# Patient Record
Sex: Female | Born: 1970 | Race: Black or African American | Hispanic: No | Marital: Single | Smoking: Never smoker
Health system: Southern US, Community
[De-identification: ages and names within clinical notes are randomized; demographics above are authoritative.]

## PROBLEM LIST (undated history)

## (undated) DIAGNOSIS — J9819 Other pulmonary collapse: Secondary | ICD-10-CM

## (undated) HISTORY — PX: OTHER SURGICAL HISTORY: SHX169

## (undated) HISTORY — PX: ABDOMINAL HYSTERECTOMY: SHX81

## (undated) HISTORY — PX: BREAST SURGERY: SHX581

## (undated) HISTORY — PX: LUNG SURGERY: SHX703

---

## 2007-09-10 ENCOUNTER — Emergency Department (HOSPITAL_BASED_OUTPATIENT_CLINIC_OR_DEPARTMENT_OTHER): Admission: EM | Admit: 2007-09-10 | Discharge: 2007-09-10 | Payer: Self-pay | Admitting: Emergency Medicine

## 2007-11-28 ENCOUNTER — Emergency Department (HOSPITAL_BASED_OUTPATIENT_CLINIC_OR_DEPARTMENT_OTHER): Admission: EM | Admit: 2007-11-28 | Discharge: 2007-11-28 | Payer: Self-pay | Admitting: Emergency Medicine

## 2009-04-21 ENCOUNTER — Emergency Department (HOSPITAL_BASED_OUTPATIENT_CLINIC_OR_DEPARTMENT_OTHER): Admission: EM | Admit: 2009-04-21 | Discharge: 2009-04-22 | Payer: Self-pay | Admitting: Emergency Medicine

## 2009-04-21 ENCOUNTER — Ambulatory Visit: Payer: Self-pay | Admitting: Diagnostic Radiology

## 2009-04-22 ENCOUNTER — Ambulatory Visit: Payer: Self-pay | Admitting: Diagnostic Radiology

## 2010-01-02 ENCOUNTER — Emergency Department (HOSPITAL_BASED_OUTPATIENT_CLINIC_OR_DEPARTMENT_OTHER): Admission: EM | Admit: 2010-01-02 | Discharge: 2010-01-02 | Payer: Self-pay | Admitting: Emergency Medicine

## 2010-06-22 LAB — URINALYSIS, ROUTINE W REFLEX MICROSCOPIC
Glucose, UA: NEGATIVE mg/dL
Hgb urine dipstick: NEGATIVE
Nitrite: NEGATIVE
Specific Gravity, Urine: 1.019 (ref 1.005–1.030)

## 2011-01-01 LAB — RAPID STREP SCREEN (MED CTR MEBANE ONLY): Streptococcus, Group A Screen (Direct): NEGATIVE

## 2012-08-07 ENCOUNTER — Emergency Department (HOSPITAL_BASED_OUTPATIENT_CLINIC_OR_DEPARTMENT_OTHER): Payer: No Typology Code available for payment source

## 2012-08-07 ENCOUNTER — Emergency Department (HOSPITAL_BASED_OUTPATIENT_CLINIC_OR_DEPARTMENT_OTHER)
Admission: EM | Admit: 2012-08-07 | Discharge: 2012-08-07 | Disposition: A | Discharge status: Home / Self Care | Payer: No Typology Code available for payment source | Attending: Emergency Medicine | Admitting: Emergency Medicine

## 2012-08-07 ENCOUNTER — Encounter (HOSPITAL_BASED_OUTPATIENT_CLINIC_OR_DEPARTMENT_OTHER): Payer: Self-pay

## 2012-08-07 DIAGNOSIS — Y92410 Unspecified street and highway as the place of occurrence of the external cause: Secondary | ICD-10-CM | POA: Insufficient documentation

## 2012-08-07 DIAGNOSIS — M25572 Pain in left ankle and joints of left foot: Secondary | ICD-10-CM

## 2012-08-07 DIAGNOSIS — M25562 Pain in left knee: Secondary | ICD-10-CM

## 2012-08-07 DIAGNOSIS — S99929A Unspecified injury of unspecified foot, initial encounter: Secondary | ICD-10-CM | POA: Insufficient documentation

## 2012-08-07 DIAGNOSIS — M25552 Pain in left hip: Secondary | ICD-10-CM

## 2012-08-07 DIAGNOSIS — Y9389 Activity, other specified: Secondary | ICD-10-CM | POA: Insufficient documentation

## 2012-08-07 DIAGNOSIS — S8990XA Unspecified injury of unspecified lower leg, initial encounter: Secondary | ICD-10-CM | POA: Insufficient documentation

## 2012-08-07 DIAGNOSIS — S79919A Unspecified injury of unspecified hip, initial encounter: Secondary | ICD-10-CM | POA: Insufficient documentation

## 2012-08-07 MED ORDER — TRAMADOL HCL 50 MG PO TABS: 50.0000 mg | ORAL_TABLET | Freq: Four times a day (QID) | ORAL | Status: DC | PRN

## 2012-08-07 NOTE — ED Notes (Addendum)
Pt further reports L foot pain that foot pain that increases when elevated or at rest. No obvious deformity or brusing noted. Pt reports hx of hyster no urine preg collected
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
## 2012-08-08 NOTE — ED Provider Notes (Signed)
Medical screening examination/treatment/procedure(s) were performed by non-physician practitioner and as supervising physician I was immediately available for consultation/collaboration.   Korey Arroyo, MD 08/08/12 2143 

## 2013-03-07 ENCOUNTER — Emergency Department (HOSPITAL_BASED_OUTPATIENT_CLINIC_OR_DEPARTMENT_OTHER)
Admission: EM | Admit: 2013-03-07 | Discharge: 2013-03-07 | Disposition: A | Discharge status: Home / Self Care | Payer: Managed Care, Other (non HMO) | Attending: Emergency Medicine | Admitting: Emergency Medicine

## 2013-03-07 ENCOUNTER — Emergency Department (HOSPITAL_BASED_OUTPATIENT_CLINIC_OR_DEPARTMENT_OTHER): Payer: Managed Care, Other (non HMO)

## 2013-03-07 ENCOUNTER — Encounter (HOSPITAL_BASED_OUTPATIENT_CLINIC_OR_DEPARTMENT_OTHER): Payer: Self-pay | Admitting: Emergency Medicine

## 2013-03-07 DIAGNOSIS — F439 Reaction to severe stress, unspecified: Secondary | ICD-10-CM

## 2013-03-07 DIAGNOSIS — R51 Headache: Secondary | ICD-10-CM | POA: Insufficient documentation

## 2013-03-07 DIAGNOSIS — F430 Acute stress reaction: Secondary | ICD-10-CM | POA: Insufficient documentation

## 2013-03-07 DIAGNOSIS — R5381 Other malaise: Secondary | ICD-10-CM | POA: Insufficient documentation

## 2013-03-07 DIAGNOSIS — R079 Chest pain, unspecified: Secondary | ICD-10-CM

## 2013-03-07 DIAGNOSIS — R1110 Vomiting, unspecified: Secondary | ICD-10-CM | POA: Insufficient documentation

## 2013-03-07 DIAGNOSIS — R0789 Other chest pain: Secondary | ICD-10-CM | POA: Insufficient documentation

## 2013-03-07 LAB — CBC WITH DIFFERENTIAL/PLATELET
Eosinophils Absolute: 0 10*3/uL (ref 0.0–0.7)
Eosinophils Relative: 0 % (ref 0–5)
HCT: 43.3 % (ref 36.0–46.0)
Hemoglobin: 14.4 g/dL (ref 12.0–15.0)
Lymphocytes Relative: 26 % (ref 12–46)
Lymphs Abs: 1.5 10*3/uL (ref 0.7–4.0)
MCHC: 33.3 g/dL (ref 30.0–36.0)
Neutro Abs: 3.8 10*3/uL (ref 1.7–7.7)
RBC: 5.15 MIL/uL — ABNORMAL HIGH (ref 3.87–5.11)

## 2013-03-07 LAB — BASIC METABOLIC PANEL
BUN: 14 mg/dL (ref 6–23)
CO2: 24 mEq/L (ref 19–32)
Creatinine, Ser: 0.9 mg/dL (ref 0.50–1.10)
GFR calc Af Amer: >90 mL/min (ref >90)
Glucose, Bld: 91 mg/dL (ref 70–99)
Potassium: 4.5 mEq/L (ref 3.5–5.1)
Sodium: 136 mEq/L (ref 135–145)

## 2013-03-07 MED ORDER — ASPIRIN 81 MG PO CHEW
324.0000 mg | CHEWABLE_TABLET | Freq: Once | ORAL | Status: AC
  Administered 2013-03-07: 324 mg via ORAL
  Filled 2013-03-07: qty 4

## 2013-03-07 MED ORDER — KETOROLAC TROMETHAMINE 30 MG/ML IJ SOLN
30.0000 mg | Freq: Once | INTRAMUSCULAR | Status: AC
  Administered 2013-03-07: 30 mg via INTRAVENOUS
  Filled 2013-03-07: qty 1
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
## 2013-03-07 NOTE — ED Notes (Signed)
Chest pain recurrent for past few days. Nausea, weakness, headache and arm tingling. Pt reports a lot of stress lately.

## 2014-10-09 ENCOUNTER — Encounter (HOSPITAL_COMMUNITY): Payer: Self-pay | Admitting: Emergency Medicine

## 2014-10-09 ENCOUNTER — Emergency Department (INDEPENDENT_AMBULATORY_CARE_PROVIDER_SITE_OTHER)
Admission: EM | Admit: 2014-10-09 | Discharge: 2014-10-09 | Disposition: A | Discharge status: Home / Self Care | Payer: Self-pay | Source: Home / Self Care | Attending: Emergency Medicine | Admitting: Emergency Medicine

## 2014-10-09 DIAGNOSIS — S39012A Strain of muscle, fascia and tendon of lower back, initial encounter: Secondary | ICD-10-CM

## 2014-10-09 DIAGNOSIS — S161XXA Strain of muscle, fascia and tendon at neck level, initial encounter: Secondary | ICD-10-CM

## 2014-10-09 MED ORDER — TRAMADOL HCL 50 MG PO TABS: 50.0000 mg | ORAL_TABLET | Freq: Four times a day (QID) | ORAL | Status: DC | PRN

## 2014-10-09 MED ORDER — CYCLOBENZAPRINE HCL 10 MG PO TABS: 10.0000 mg | ORAL_TABLET | Freq: Three times a day (TID) | ORAL | Status: DC | PRN

## 2014-10-09 MED ORDER — NAPROXEN 500 MG PO TABS: 500.0000 mg | ORAL_TABLET | Freq: Two times a day (BID) | ORAL | Status: DC

## 2014-10-09 NOTE — ED Notes (Signed)
C/o intermittent lower back pain that radiates to the head Reports she is under a lot of stress.... Loss of job Alert, no signs of acute distress.
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
## 2014-10-09 NOTE — Discharge Instructions (Signed)
You have strain and spasm of the muscles in your back and neck. Take Flexeril every 8 hours as needed for muscle spasms. This may make you sleepy. Take Naprosyn 1 pill twice a day for the next week, then as needed. Apply heat at least 2-3 times a day. Do gentle stretching and massage as often as you can. Use the tramadol every 6-8 hours as needed for pain. You should see gradual improvement over the next week. Follow-up as needed.

## 2017-03-03 ENCOUNTER — Other Ambulatory Visit: Payer: Self-pay

## 2017-03-03 ENCOUNTER — Encounter (HOSPITAL_BASED_OUTPATIENT_CLINIC_OR_DEPARTMENT_OTHER): Payer: Self-pay

## 2017-03-03 ENCOUNTER — Emergency Department (HOSPITAL_BASED_OUTPATIENT_CLINIC_OR_DEPARTMENT_OTHER)
Admission: EM | Admit: 2017-03-03 | Discharge: 2017-03-03 | Disposition: A | Discharge status: Home / Self Care | Payer: Self-pay | Attending: Emergency Medicine | Admitting: Emergency Medicine

## 2017-03-03 ENCOUNTER — Emergency Department (HOSPITAL_BASED_OUTPATIENT_CLINIC_OR_DEPARTMENT_OTHER): Payer: Self-pay

## 2017-03-03 DIAGNOSIS — R42 Dizziness and giddiness: Secondary | ICD-10-CM | POA: Insufficient documentation

## 2017-03-03 DIAGNOSIS — R110 Nausea: Secondary | ICD-10-CM | POA: Insufficient documentation

## 2017-03-03 DIAGNOSIS — K219 Gastro-esophageal reflux disease without esophagitis: Secondary | ICD-10-CM | POA: Insufficient documentation

## 2017-03-03 DIAGNOSIS — R0789 Other chest pain: Secondary | ICD-10-CM | POA: Insufficient documentation

## 2017-03-03 HISTORY — DX: Other pulmonary collapse: J98.19

## 2017-03-03 LAB — BASIC METABOLIC PANEL
Anion gap: 6 (ref 5–15)
BUN: 18 mg/dL (ref 6–20)
CO2: 26 mmol/L (ref 22–32)
Calcium: 9.3 mg/dL (ref 8.9–10.3)
Chloride: 104 mmol/L (ref 101–111)
Creatinine, Ser: 0.95 mg/dL (ref 0.44–1.00)
GFR calc Af Amer: >60 mL/min (ref >60)
GLUCOSE: 104 mg/dL — AB (ref 65–99)
POTASSIUM: 4.1 mmol/L (ref 3.5–5.1)
Sodium: 136 mmol/L (ref 135–145)

## 2017-03-03 LAB — CBC
HEMATOCRIT: 41.1 % (ref 36.0–46.0)
Hemoglobin: 13.5 g/dL (ref 12.0–15.0)
MCH: 27.8 pg (ref 26.0–34.0)
MCHC: 32.8 g/dL (ref 30.0–36.0)
MCV: 84.7 fL (ref 78.0–100.0)
Platelets: 262 10*3/uL (ref 150–400)
RBC: 4.85 MIL/uL (ref 3.87–5.11)
RDW: 13.9 % (ref 11.5–15.5)
WBC: 7.9 10*3/uL (ref 4.0–10.5)

## 2017-03-03 LAB — TROPONIN I: Troponin I: <0.03 ng/mL (ref <0.03)

## 2017-03-03 MED ORDER — PANTOPRAZOLE SODIUM 40 MG PO TBEC: 40.0000 mg | DELAYED_RELEASE_TABLET | Freq: Every day | ORAL | 0 refills | Status: AC

## 2017-03-03 MED FILL — PANTOPRAZOLE SOD DR 40 MG T: 40 | 30 days supply | Qty: 30 | Fill #0

## 2017-03-03 NOTE — ED Triage Notes (Signed)
C/o CP x 3 days-NAD-steady gait 
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
## 2017-03-03 NOTE — ED Notes (Signed)
Patient transported to X-ray 

## 2018-04-13 ENCOUNTER — Encounter (HOSPITAL_BASED_OUTPATIENT_CLINIC_OR_DEPARTMENT_OTHER): Payer: Self-pay

## 2018-04-13 ENCOUNTER — Emergency Department (HOSPITAL_BASED_OUTPATIENT_CLINIC_OR_DEPARTMENT_OTHER)
Admission: EM | Admit: 2018-04-13 | Discharge: 2018-04-13 | Disposition: A | Discharge status: Home / Self Care | Payer: BLUE CROSS/BLUE SHIELD | Attending: Emergency Medicine | Admitting: Emergency Medicine

## 2018-04-13 ENCOUNTER — Other Ambulatory Visit: Payer: Self-pay

## 2018-04-13 DIAGNOSIS — W19XXXD Unspecified fall, subsequent encounter: Secondary | ICD-10-CM

## 2018-04-13 DIAGNOSIS — M25571 Pain in right ankle and joints of right foot: Secondary | ICD-10-CM | POA: Diagnosis present

## 2018-04-13 DIAGNOSIS — G8929 Other chronic pain: Secondary | ICD-10-CM | POA: Insufficient documentation

## 2018-04-13 DIAGNOSIS — Z79899 Other long term (current) drug therapy: Secondary | ICD-10-CM | POA: Diagnosis not present
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
## 2018-04-13 NOTE — Discharge Instructions (Signed)
You can take Tylenol or Ibuprofen as directed for pain. You can alternate Tylenol and Ibuprofen every 4 hours. If you take Tylenol at 1pm, then you can take Ibuprofen at 5pm. Then you can take Tylenol again at 9pm.   Continue taking your previously prescribed muscle relaxers.  As we discussed, follow-up with referred orthopedic doctor for further evaluation.  Return to the Emergency Department immediately for any worsening back pain, neck pain, difficulty walking, numbness/weaknss of your arms or legs, urinary or bowel accidents, fever or any other worsening or concerning symptoms.
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
## 2018-09-02 IMAGING — CR DG CHEST 2V
2 series · 2 of 2 positions shown · non-contrast
Comparison: 03/07/2013

CLINICAL DATA: Left-sided chest pain and burning.

EXAM:
CHEST  2 VIEW

[w chest pa]
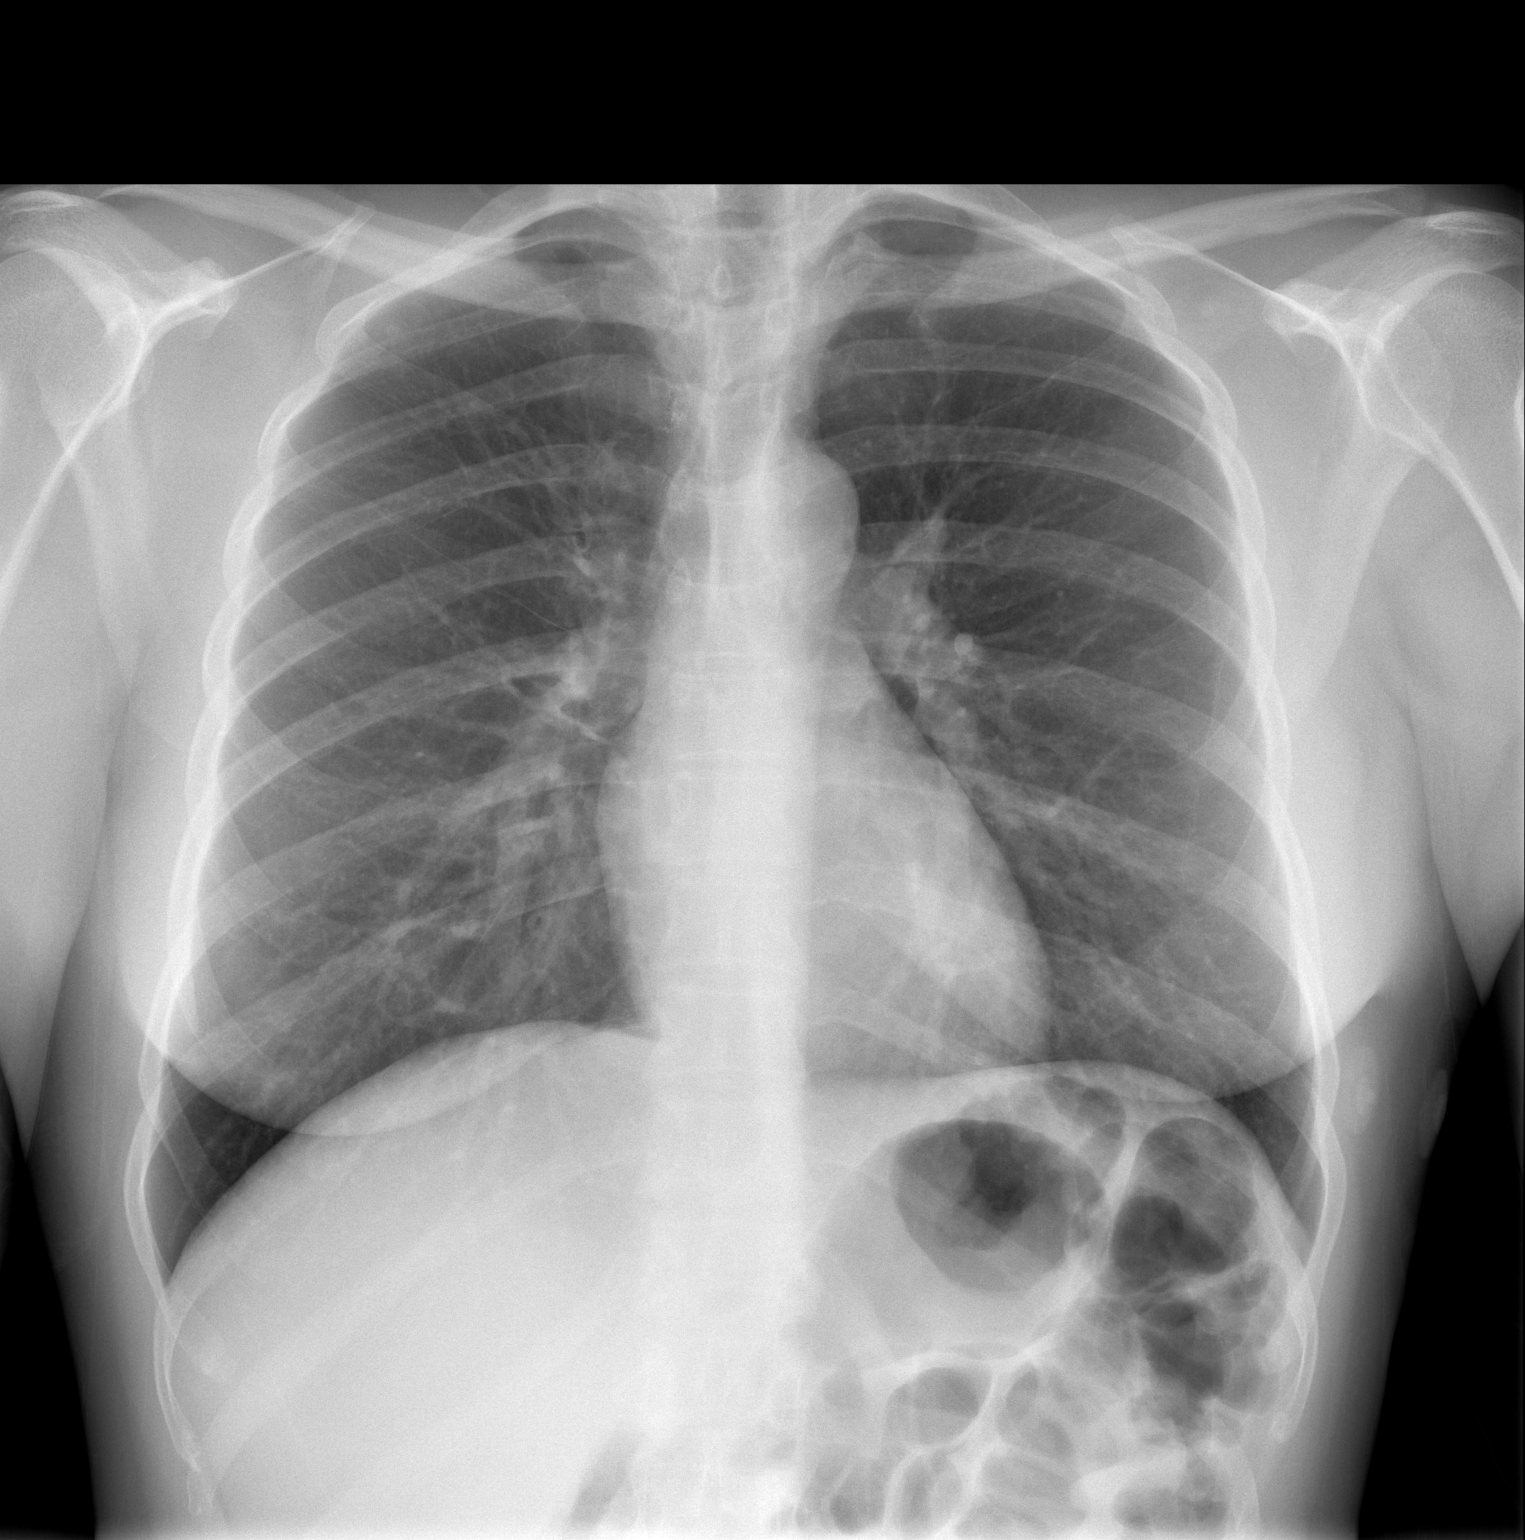

[w chest lat]
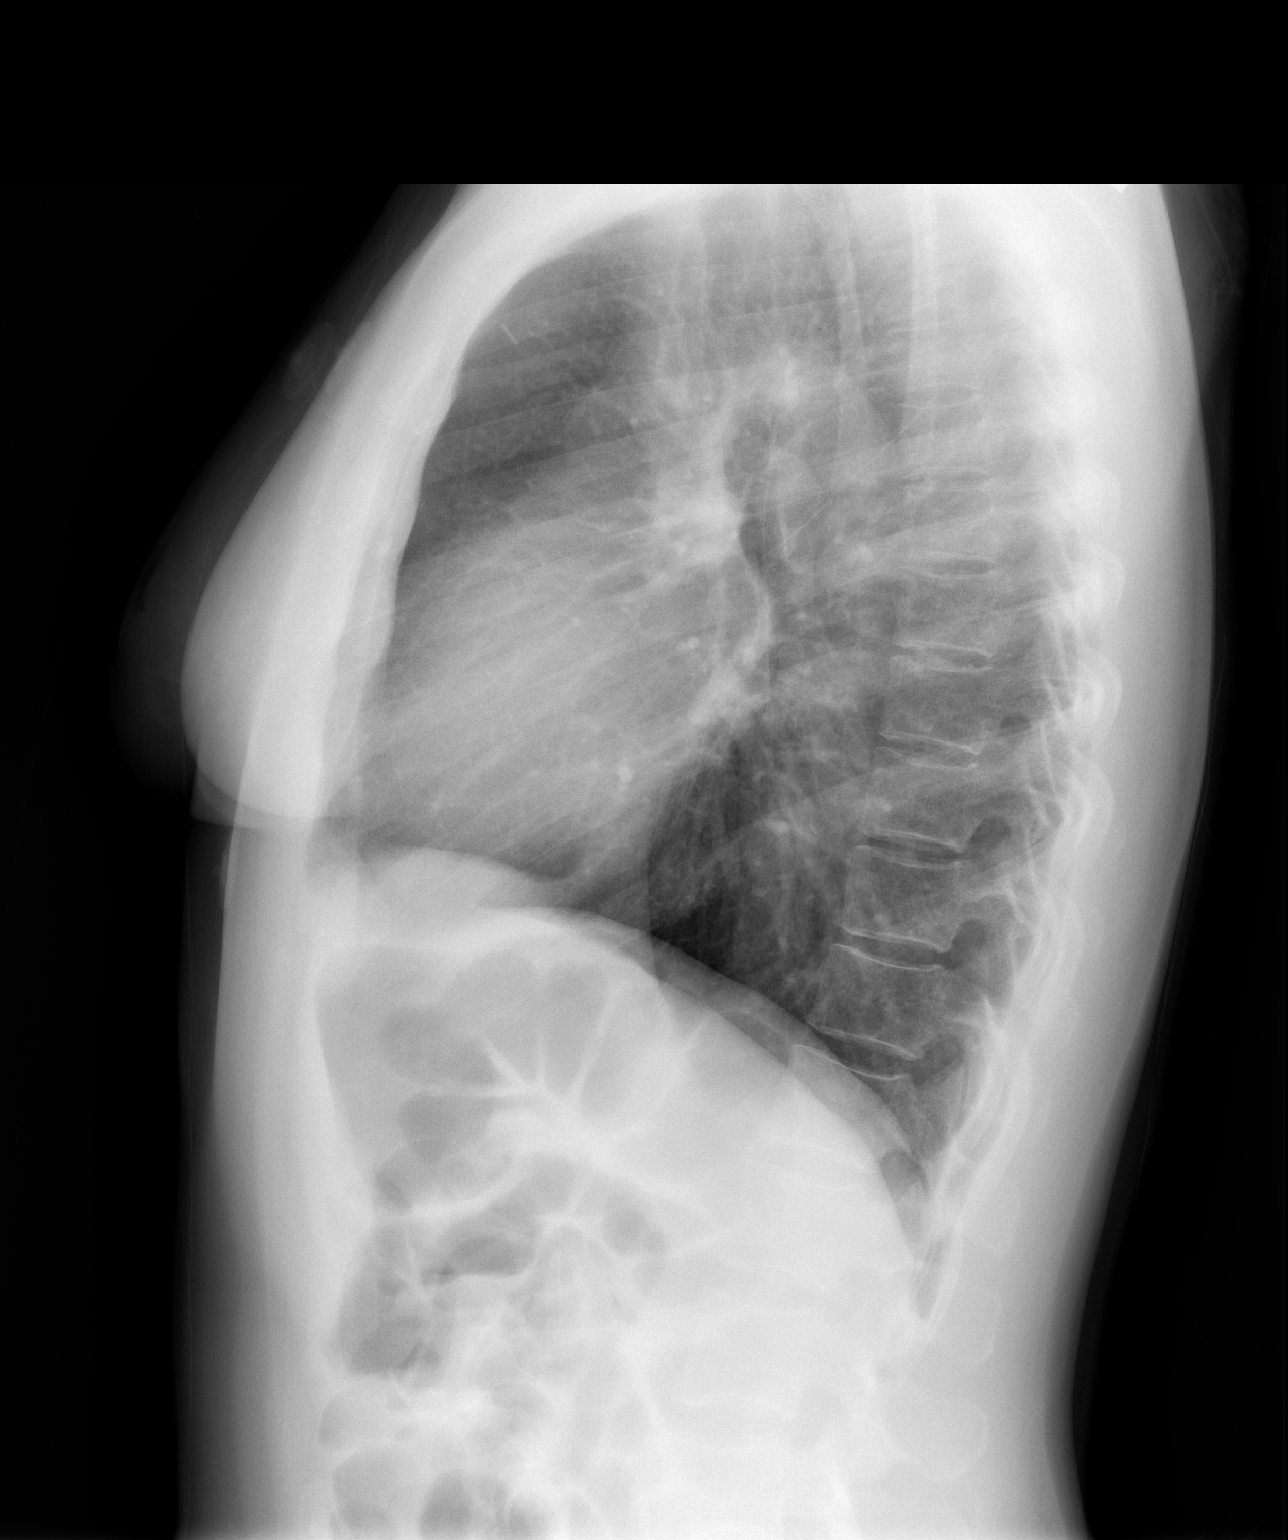

[2 of 2 positions shown; findings below may reference images not displayed]

FINDINGS: Cardiomediastinal silhouette is normal. Mediastinal contours appear
intact.

There is no evidence of focal airspace consolidation, pleural
effusion or pneumothorax.

Osseous structures are without acute abnormality. Soft tissues are
grossly normal.
IMPRESSION: No active cardiopulmonary disease.

## 2020-06-22 ENCOUNTER — Encounter (HOSPITAL_BASED_OUTPATIENT_CLINIC_OR_DEPARTMENT_OTHER): Payer: Self-pay | Admitting: Emergency Medicine

## 2020-06-22 ENCOUNTER — Emergency Department (HOSPITAL_BASED_OUTPATIENT_CLINIC_OR_DEPARTMENT_OTHER): Payer: Medicare Other

## 2020-06-22 ENCOUNTER — Emergency Department (HOSPITAL_BASED_OUTPATIENT_CLINIC_OR_DEPARTMENT_OTHER)
Admission: EM | Admit: 2020-06-22 | Discharge: 2020-06-23 | Disposition: A | Discharge status: Home / Self Care | Payer: Medicare Other | Attending: Emergency Medicine | Admitting: Emergency Medicine

## 2020-06-22 ENCOUNTER — Other Ambulatory Visit: Payer: Self-pay

## 2020-06-22 DIAGNOSIS — R0789 Other chest pain: Secondary | ICD-10-CM | POA: Diagnosis not present

## 2020-06-22 DIAGNOSIS — K219 Gastro-esophageal reflux disease without esophagitis: Secondary | ICD-10-CM | POA: Diagnosis not present

## 2020-06-22 DIAGNOSIS — R0602 Shortness of breath: Secondary | ICD-10-CM | POA: Insufficient documentation

## 2020-06-22 DIAGNOSIS — F419 Anxiety disorder, unspecified: Secondary | ICD-10-CM | POA: Insufficient documentation

## 2020-06-22 LAB — CBC WITH DIFFERENTIAL/PLATELET
Abs Immature Granulocytes: 0.01 10*3/uL (ref 0.00–0.07)
Basophils Absolute: 0 10*3/uL (ref 0.0–0.1)
Basophils Relative: 1 %
Eosinophils Absolute: 0.1 10*3/uL (ref 0.0–0.5)
Eosinophils Relative: 1 %
HCT: 39 % (ref 36.0–46.0)
Hemoglobin: 13.2 g/dL (ref 12.0–15.0)
Immature Granulocytes: 0 %
Lymphocytes Relative: 45 %
Lymphs Abs: 2.6 10*3/uL (ref 0.7–4.0)
MCH: 28.6 pg (ref 26.0–34.0)
MCHC: 33.8 g/dL (ref 30.0–36.0)
MCV: 84.6 fL (ref 80.0–100.0)
Monocytes Absolute: 0.4 10*3/uL (ref 0.1–1.0)
Monocytes Relative: 7 %
Neutro Abs: 2.7 10*3/uL (ref 1.7–7.7)
Neutrophils Relative %: 46 %
Platelets: 242 10*3/uL (ref 150–400)
RBC: 4.61 MIL/uL (ref 3.87–5.11)
RDW: 13.7 % (ref 11.5–15.5)
WBC: 5.9 10*3/uL (ref 4.0–10.5)
nRBC: 0 % (ref 0.0–0.2)

## 2020-06-22 LAB — BASIC METABOLIC PANEL
Anion gap: 10 (ref 5–15)
BUN: 20 mg/dL (ref 6–20)
CO2: 24 mmol/L (ref 22–32)
Calcium: 9.3 mg/dL (ref 8.9–10.3)
Chloride: 105 mmol/L (ref 98–111)
Creatinine, Ser: 1.17 mg/dL — ABNORMAL HIGH (ref 0.44–1.00)
GFR, Estimated: 57 mL/min — ABNORMAL LOW (ref >60)
Glucose, Bld: 98 mg/dL (ref 70–99)
Potassium: 3.7 mmol/L (ref 3.5–5.1)
Sodium: 139 mmol/L (ref 135–145)

## 2020-06-22 MED ORDER — HYDROXYZINE HCL 10 MG PO TABS: 10.0000 mg | ORAL_TABLET | Freq: Three times a day (TID) | ORAL | 0 refills | Status: AC | PRN
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
## 2020-06-22 NOTE — ED Notes (Signed)
EDP at bedside  

## 2020-06-22 NOTE — Discharge Instructions (Addendum)
Alexis Hawkins, it was a pleasure taking care of you. Your testing here is reassuring, and there is no sign that you are having a cardiac event. Unfortunately the test for a blood clot could not be completed due to a lab issue, but you have no risk factors for this and it would be extremely unlikely. Your chest x-ray and EKG were also normal.   I am writing a short course of hydroxyzine, which you may take as needed for anxiety episodes. Please follow up with your psychiatrist and they can prescribe more of this if it is helpful. I would also strongly recommend starting a once-daily medication. There are several options, but they all take some time to become effective, so it may require several weeks before you see a benefit from them.  Your creatinine, which is a measure of kidney function, was mildly elevated which could be related to mild dehydration. Please follow up with your PCP to recheck this.

## 2020-06-22 NOTE — ED Triage Notes (Signed)
Reports SOB for the last two weeks.  Endorses central CP yesterday but none today.  Hx of lung collapse and surgery.

## 2020-06-23 LAB — CBG MONITORING, ED: Glucose-Capillary: 100 mg/dL — ABNORMAL HIGH (ref 70–99)

## 2020-06-23 LAB — TROPONIN I (HIGH SENSITIVITY): Troponin I (High Sensitivity): <2 ng/L (ref <18)

## 2020-06-23 NOTE — ED Provider Notes (Signed)
I assumed care of this patient.  Please see previous provider note for further details of Hx, PE.  Briefly patient is a 50 y.o. female who presented pleuritic chest pain pending metabolic panel and troponin.  Labs reassuring. Trop negative.  The patient appears reasonably screened and/or stabilized for discharge and I doubt any other medical condition or other [facility] requiring further screening, evaluation, or treatment in the ED at this time prior to discharge. Safe for discharge with strict return precautions.  Disposition: Discharge  Condition: Good  I have discussed the results, Dx and Tx plan with the patient/family who expressed understanding and agree(s) with the plan. Discharge instructions discussed at length. The patient/family was given strict return precautions who verbalized understanding of the instructions. No further questions at time of discharge.    ED Discharge Orders         Ordered    hydrOXYzine (ATARAX/VISTARIL) 10 MG tablet  3 times daily PRN        06/22/20 2338            Follow Up: Patient, No Pcp Per  Schedule an appointment as soon as possible for a visit in 1 week    .       Nira Conn, MD 06/23/20 0030

## 2021-03-21 ENCOUNTER — Encounter (INDEPENDENT_AMBULATORY_CARE_PROVIDER_SITE_OTHER): Payer: Self-pay

## 2021-12-22 IMAGING — DX DG CHEST 2V
2 series · 2 of 2 positions shown · non-contrast
Comparison: 03/03/2017

CLINICAL DATA: Chest pain, shortness of breath

EXAM:
CHEST - 2 VIEW

[chest pa]
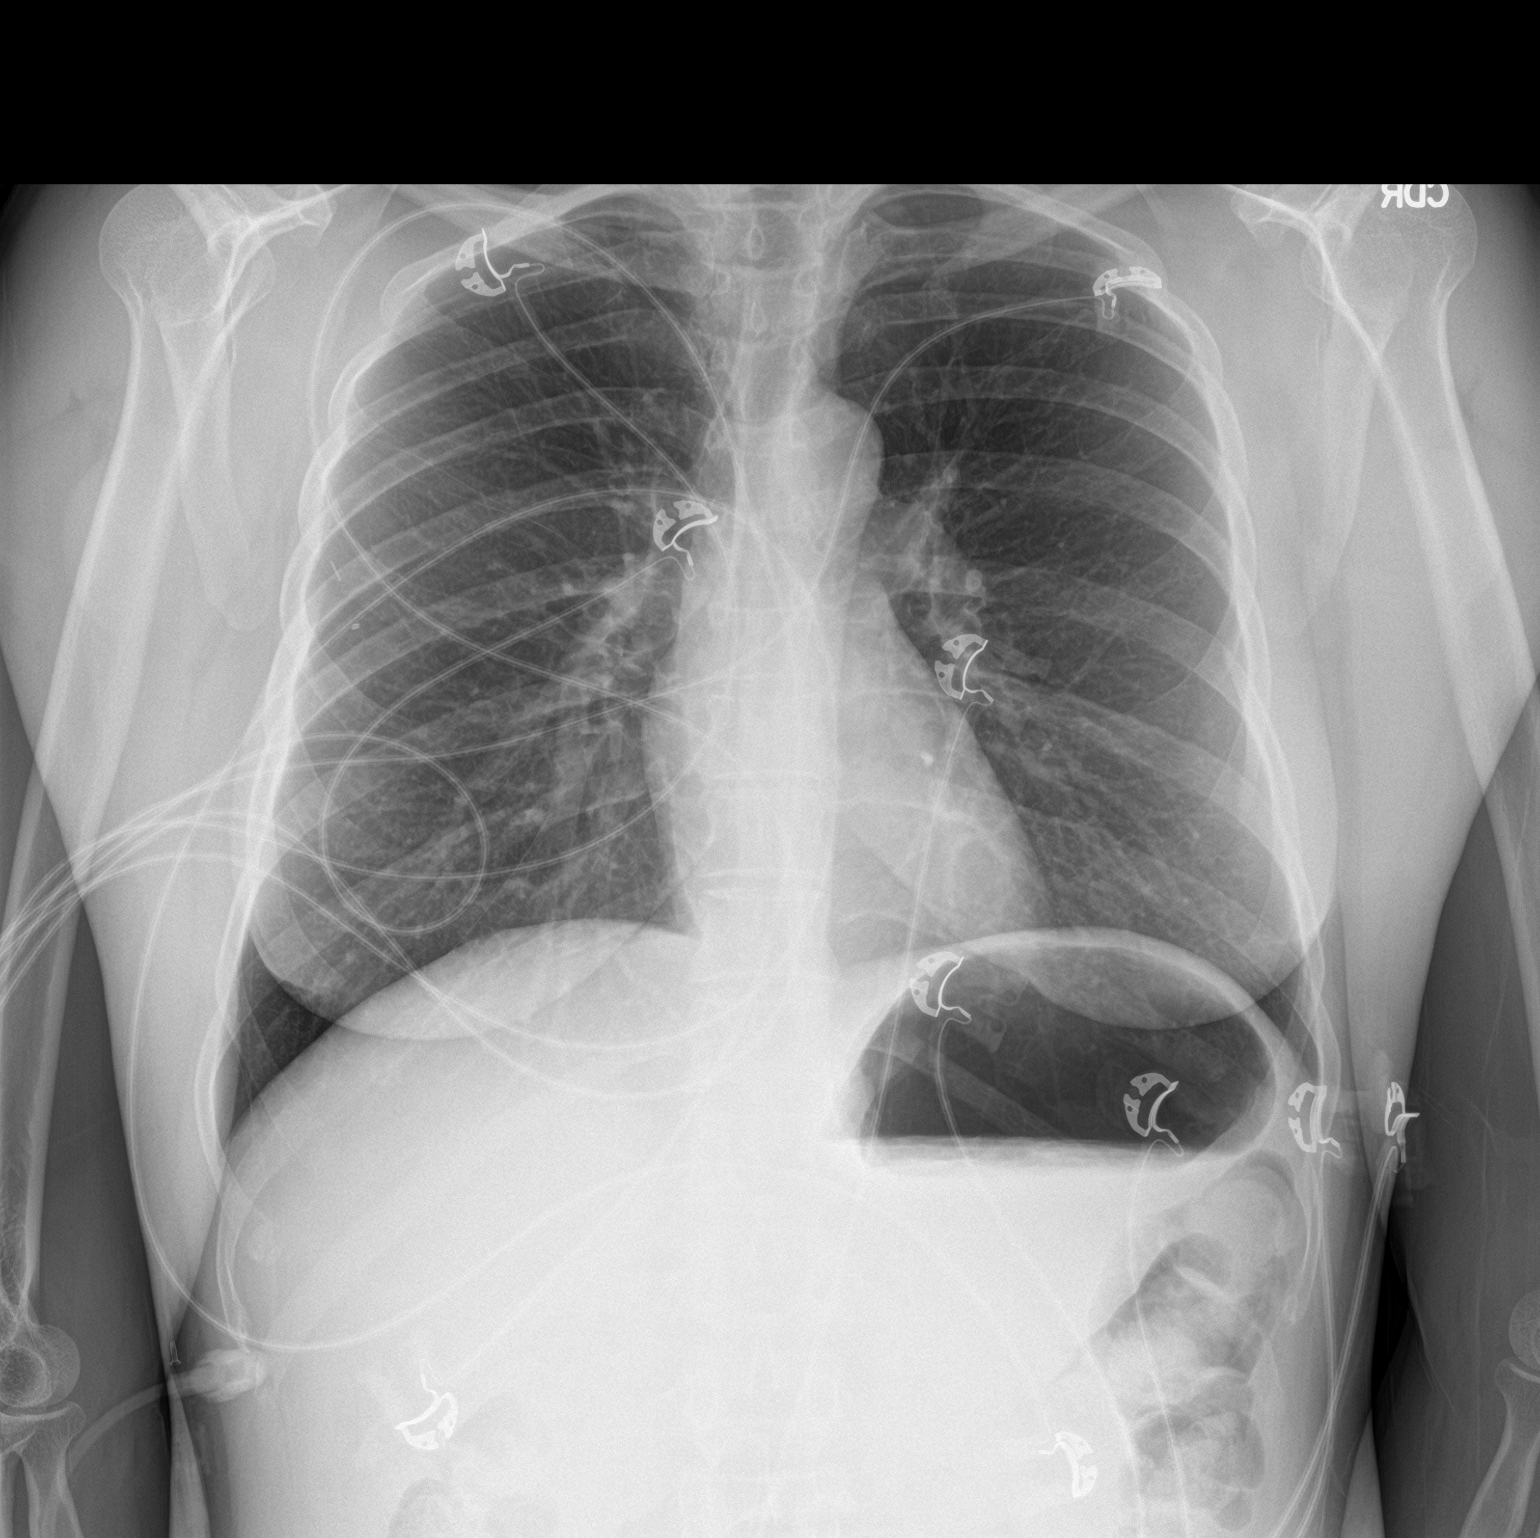

[chest lat]
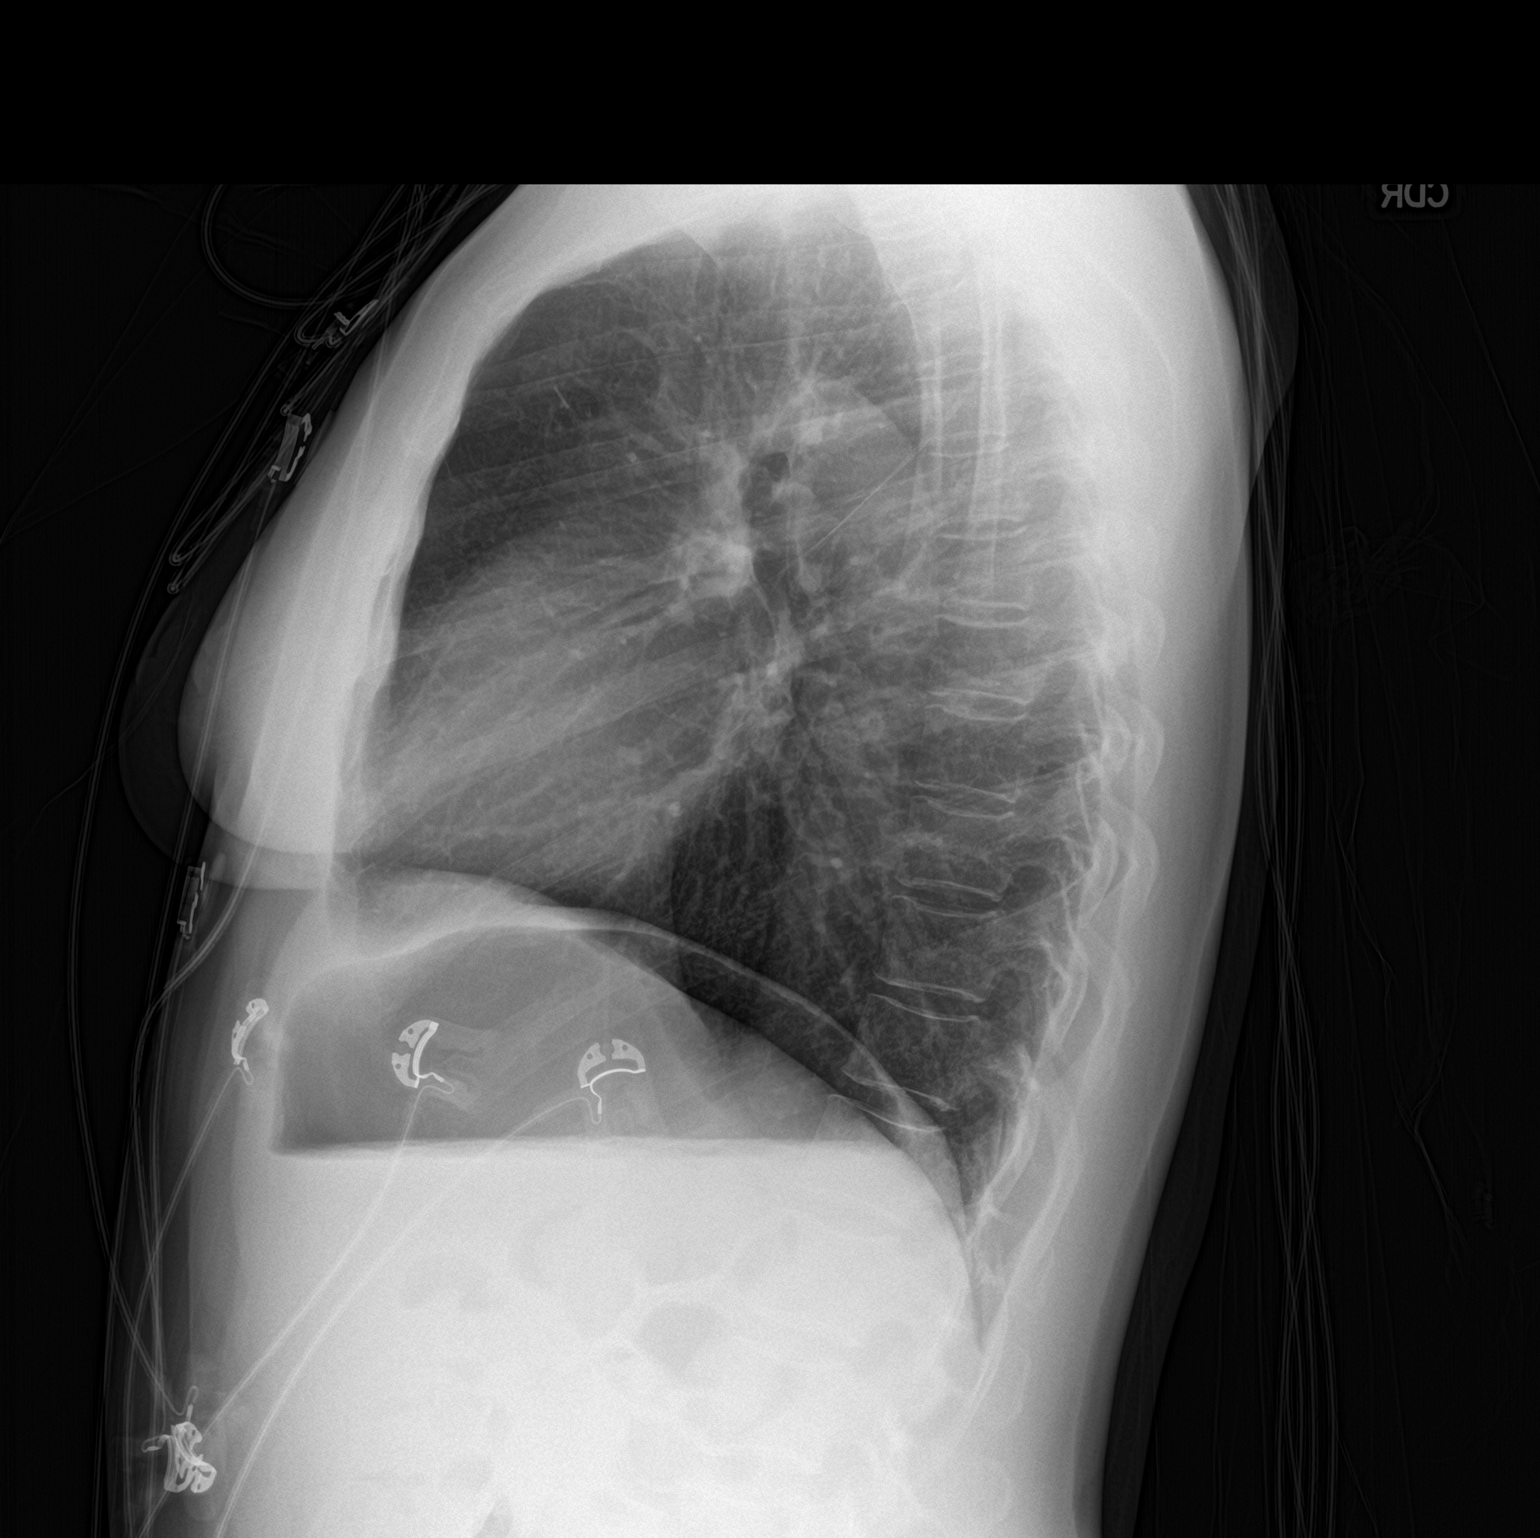

[2 of 2 positions shown; findings below may reference images not displayed]

FINDINGS: The heart size and mediastinal contours are within normal limits.
Both lungs are clear. The visualized skeletal structures are
unremarkable.
IMPRESSION: Negative.

## 2022-06-29 ENCOUNTER — Encounter (HOSPITAL_BASED_OUTPATIENT_CLINIC_OR_DEPARTMENT_OTHER): Payer: Self-pay | Admitting: *Deleted

## 2022-06-29 ENCOUNTER — Emergency Department (HOSPITAL_BASED_OUTPATIENT_CLINIC_OR_DEPARTMENT_OTHER)
Admission: EM | Admit: 2022-06-29 | Discharge: 2022-06-29 | Disposition: A | Discharge status: Home / Self Care | Payer: Medicare PPO | Attending: Emergency Medicine | Admitting: Emergency Medicine

## 2022-06-29 ENCOUNTER — Other Ambulatory Visit: Payer: Self-pay

## 2022-06-29 DIAGNOSIS — L989 Disorder of the skin and subcutaneous tissue, unspecified: Secondary | ICD-10-CM | POA: Diagnosis present

## 2022-06-29 DIAGNOSIS — L271 Localized skin eruption due to drugs and medicaments taken internally: Secondary | ICD-10-CM | POA: Diagnosis not present

## 2022-06-29 DIAGNOSIS — R21 Rash and other nonspecific skin eruption: Secondary | ICD-10-CM

## 2022-06-29 MED ORDER — BACITRACIN ZINC 500 UNIT/GM EX OINT: 1.0000 | TOPICAL_OINTMENT | Freq: Two times a day (BID) | CUTANEOUS | 0 refills | Status: AC

## 2022-06-29 NOTE — ED Provider Notes (Signed)
  [facility] Provider Note   CSN: [id number] Arrival date & time: 06/29/22  2001     History {Add pertinent medical, surgical, social history, OB history to HPI:1} Chief Complaint  Patient presents with   Skin Problem    Alexis Hawkins is a 52 y.o. female.  HPI     Home Medications Prior to Admission medications   Medication Sig Start Date End Date Taking? Authorizing Provider  hydrOXYzine (ATARAX/VISTARIL) 10 MG tablet Take 1 tablet (10 mg total) by mouth 3 (three) times daily as needed. 06/22/20   Andrew Au, MD  lamoTRIgine (LAMICTAL) 150 MG tablet Take by mouth. 06/05/20   [provider]  omeprazole (PRILOSEC) 20 MG capsule Take by mouth. 06/19/20   [provider]  pantoprazole (PROTONIX) 40 MG tablet Take 1 tablet (40 mg total) by mouth daily. 03/03/17   Sherwood Gambler, MD  zolpidem Lorrin Mais) 5 MG tablet Take by mouth. 04/03/20   [provider]      Allergies    Patient has no known allergies.    Review of Systems   Review of Systems  Physical Exam Updated Vital Signs BP 120/83 (BP Location: Left Arm)   Pulse 82   Temp 98.3 F (36.8 C) (Oral)   Resp 14   SpO2 99%  Physical Exam    ED Results / Procedures / Treatments   Labs (all labs ordered are listed, but only abnormal results are displayed) Labs Reviewed - No data to display  EKG None  Radiology No results found.  Procedures Procedures  {Document cardiac monitor, telemetry assessment procedure when appropriate:1}  Medications Ordered in ED Medications - No data to display  ED Course/ Medical Decision Making/ A&P   {   Click here for ABCD2, HEART and other calculatorsREFRESH Note before signing :1}                          Medical Decision Making Risk OTC drugs.   ***  {Document critical care time when appropriate:1} {Document review of labs and clinical decision tools ie heart score, Chads2Vasc2 etc:1}   {Document your independent review of radiology images, and any outside records:1} {Document your discussion with family members, caretakers, and with consultants:1} {Document social determinants of health affecting pt's care:1} {Document your decision making why or why not admission, treatments were needed:1} Final Clinical Impression(s) / ED Diagnoses Final diagnoses:  None    Rx / DC Orders ED Discharge Orders     None

## 2022-06-29 NOTE — ED Triage Notes (Signed)
Pt has a round spot on forehead which is slightly red but not painful and not itching.  Pt is unsure what has caused this.  No fever.

## 2022-06-29 NOTE — Discharge Instructions (Addendum)
You were seen in the ER today for evaluation of the bump on your forehead.  I am not sure what this is exactly however I do not think it is an emergent skin condition.  I am prescribing some bacitracin ointment to apply to the area.  Also recommend warm compresses.  Please follow-up with your PCP within next few days for reevaluation.  If you have any concerns, new or worsening symptoms, please return to the nearest emergency department for evaluation.  Contact a doctor if: You sweat at night. You lose weight. You pee (urinate) more than normal. You pee less than normal, or you notice that your pee is a darker color than normal. You feel weak. You throw up (vomit). Your skin or the whites of your eyes look yellow (jaundice). Your skin: Tingles. Is numb. Your rash: Does not go away after a few days. Gets worse. You are: More thirsty than normal. More tired than normal. You have: New symptoms. Pain in your belly (abdomen). A fever. Watery poop (diarrhea). Get help right away if: You have a fever and your symptoms suddenly get worse. You start to feel mixed up (confused). You have a very bad headache or a stiff neck. You have very bad joint pains or stiffness. You have jerky movements that you cannot control (seizure). Your rash covers all or most of your body. The rash may or may not be painful. You have blisters that: Are on top of the rash. Grow larger. Grow together. Are painful. Are inside your nose or mouth. You have a rash that: Looks like purple pinprick-sized spots all over your body. Has a "bull's eye" or looks like a target. Is red and painful, causes your skin to peel, and is not from being in the sun too long.

## 2023-05-10 DIAGNOSIS — R1032 Left lower quadrant pain: Secondary | ICD-10-CM | POA: Diagnosis not present

## 2023-05-30 DIAGNOSIS — F411 Generalized anxiety disorder: Secondary | ICD-10-CM | POA: Diagnosis not present

## 2023-05-30 DIAGNOSIS — F4321 Adjustment disorder with depressed mood: Secondary | ICD-10-CM | POA: Diagnosis not present

## 2023-05-30 DIAGNOSIS — F332 Major depressive disorder, recurrent severe without psychotic features: Secondary | ICD-10-CM | POA: Diagnosis not present

## 2023-05-30 DIAGNOSIS — Z6824 Body mass index (BMI) 24.0-24.9, adult: Secondary | ICD-10-CM | POA: Diagnosis not present

## 2023-05-30 DIAGNOSIS — F5102 Adjustment insomnia: Secondary | ICD-10-CM | POA: Diagnosis not present

## 2023-05-31 DIAGNOSIS — F439 Reaction to severe stress, unspecified: Secondary | ICD-10-CM | POA: Diagnosis not present

## 2023-05-31 DIAGNOSIS — Z634 Disappearance and death of family member: Secondary | ICD-10-CM | POA: Diagnosis not present

## 2023-06-04 DIAGNOSIS — M79651 Pain in right thigh: Secondary | ICD-10-CM | POA: Diagnosis not present

## 2023-06-04 DIAGNOSIS — E559 Vitamin D deficiency, unspecified: Secondary | ICD-10-CM | POA: Diagnosis not present

## 2023-06-04 DIAGNOSIS — R5383 Other fatigue: Secondary | ICD-10-CM | POA: Diagnosis not present

## 2023-06-04 DIAGNOSIS — E538 Deficiency of other specified B group vitamins: Secondary | ICD-10-CM | POA: Diagnosis not present

## 2023-06-04 DIAGNOSIS — Z79899 Other long term (current) drug therapy: Secondary | ICD-10-CM | POA: Diagnosis not present

## 2023-06-04 DIAGNOSIS — E7800 Pure hypercholesterolemia, unspecified: Secondary | ICD-10-CM | POA: Diagnosis not present
